# Patient Record
Sex: Female | Born: 1960 | Race: White | Hispanic: No | Marital: Single | Smoking: Current every day smoker
Health system: Southern US, Community
[De-identification: ages and names within clinical notes are randomized; demographics above are authoritative.]

## PROBLEM LIST (undated history)

## (undated) ENCOUNTER — Emergency Department (HOSPITAL_COMMUNITY): Discharge status: Against Medical Advice | Payer: Self-pay

## (undated) DIAGNOSIS — I10 Essential (primary) hypertension: Secondary | ICD-10-CM

## (undated) DIAGNOSIS — J45909 Unspecified asthma, uncomplicated: Secondary | ICD-10-CM

## (undated) DIAGNOSIS — J449 Chronic obstructive pulmonary disease, unspecified: Secondary | ICD-10-CM

## (undated) HISTORY — PX: TONSILLECTOMY: SUR1361

## (undated) HISTORY — PX: CHOLECYSTECTOMY: SHX55

## (undated) HISTORY — PX: ABDOMINAL HYSTERECTOMY: SHX81

## (undated) HISTORY — PX: APPENDECTOMY: SHX54

---

## 2003-08-26 ENCOUNTER — Emergency Department (HOSPITAL_COMMUNITY): Admission: EM | Admit: 2003-08-26 | Discharge: 2003-08-26 | Payer: Self-pay | Admitting: Emergency Medicine

## 2003-11-25 ENCOUNTER — Emergency Department (HOSPITAL_COMMUNITY): Admission: EM | Admit: 2003-11-25 | Discharge: 2003-11-25 | Payer: Self-pay | Admitting: Emergency Medicine

## 2004-04-14 ENCOUNTER — Emergency Department (HOSPITAL_COMMUNITY): Admission: EM | Admit: 2004-04-14 | Discharge: 2004-04-14 | Payer: Self-pay | Admitting: Emergency Medicine

## 2006-10-10 ENCOUNTER — Emergency Department (HOSPITAL_COMMUNITY): Admission: EM | Admit: 2006-10-10 | Discharge: 2006-10-10 | Payer: Self-pay | Admitting: Emergency Medicine

## 2011-04-09 ENCOUNTER — Other Ambulatory Visit: Payer: Self-pay

## 2011-04-09 DIAGNOSIS — D649 Anemia, unspecified: Secondary | ICD-10-CM

## 2014-11-13 ENCOUNTER — Emergency Department (HOSPITAL_BASED_OUTPATIENT_CLINIC_OR_DEPARTMENT_OTHER): Payer: Medicaid Other

## 2014-11-13 ENCOUNTER — Encounter (HOSPITAL_BASED_OUTPATIENT_CLINIC_OR_DEPARTMENT_OTHER): Payer: Self-pay

## 2014-11-13 ENCOUNTER — Emergency Department (HOSPITAL_BASED_OUTPATIENT_CLINIC_OR_DEPARTMENT_OTHER)
Admission: EM | Admit: 2014-11-13 | Discharge: 2014-11-13 | Disposition: A | Discharge status: Home / Self Care | Payer: Medicaid Other | Attending: Emergency Medicine | Admitting: Emergency Medicine

## 2014-11-13 DIAGNOSIS — I10 Essential (primary) hypertension: Secondary | ICD-10-CM | POA: Insufficient documentation

## 2014-11-13 DIAGNOSIS — Z79899 Other long term (current) drug therapy: Secondary | ICD-10-CM | POA: Diagnosis not present

## 2014-11-13 DIAGNOSIS — Z720 Tobacco use: Secondary | ICD-10-CM | POA: Insufficient documentation

## 2014-11-13 DIAGNOSIS — J441 Chronic obstructive pulmonary disease with (acute) exacerbation: Secondary | ICD-10-CM | POA: Diagnosis not present

## 2014-11-13 DIAGNOSIS — J40 Bronchitis, not specified as acute or chronic: Secondary | ICD-10-CM | POA: Diagnosis not present

## 2014-11-13 DIAGNOSIS — F419 Anxiety disorder, unspecified: Secondary | ICD-10-CM | POA: Diagnosis not present

## 2014-11-13 DIAGNOSIS — R0602 Shortness of breath: Secondary | ICD-10-CM | POA: Diagnosis present

## 2014-11-13 DIAGNOSIS — R091 Pleurisy: Secondary | ICD-10-CM

## 2014-11-13 HISTORY — DX: Essential (primary) hypertension: I10

## 2014-11-13 HISTORY — DX: Unspecified asthma, uncomplicated: J45.909

## 2014-11-13 HISTORY — DX: Chronic obstructive pulmonary disease, unspecified: J44.9

## 2014-11-13 LAB — CBC WITH DIFFERENTIAL/PLATELET
Basophils Absolute: 0.1 10*3/uL (ref 0.0–0.1)
Basophils Relative: 1 % (ref 0–1)
Eosinophils Absolute: 0.1 10*3/uL (ref 0.0–0.7)
Eosinophils Relative: 1 % (ref 0–5)
HCT: 41 % (ref 36.0–46.0)
Hemoglobin: 13.4 g/dL (ref 12.0–15.0)
Lymphocytes Relative: 23 % (ref 12–46)
Lymphs Abs: 2 10*3/uL (ref 0.7–4.0)
MCH: 26.2 pg (ref 26.0–34.0)
MCHC: 32.7 g/dL (ref 30.0–36.0)
MCV: 80.1 fL (ref 78.0–100.0)
Monocytes Absolute: 0.6 10*3/uL (ref 0.1–1.0)
Monocytes Relative: 7 % (ref 3–12)
Neutro Abs: 6 10*3/uL (ref 1.7–7.7)
Neutrophils Relative %: 68 % (ref 43–77)
Platelets: 476 10*3/uL — ABNORMAL HIGH (ref 150–400)
RBC: 5.12 MIL/uL — ABNORMAL HIGH (ref 3.87–5.11)
RDW: 17.1 % — ABNORMAL HIGH (ref 11.5–15.5)
WBC: 8.7 10*3/uL (ref 4.0–10.5)

## 2014-11-13 LAB — BASIC METABOLIC PANEL
Anion gap: 6 (ref 5–15)
BUN: 12 mg/dL (ref 6–23)
CO2: 21 mmol/L (ref 19–32)
Calcium: 9.2 mg/dL (ref 8.4–10.5)
Chloride: 110 mmol/L (ref 96–112)
Creatinine, Ser: 0.78 mg/dL (ref 0.50–1.10)
GFR calc Af Amer: >90 mL/min (ref >90)
GFR calc non Af Amer: >90 mL/min (ref >90)
Glucose, Bld: 122 mg/dL — ABNORMAL HIGH (ref 70–99)
Potassium: 4 mmol/L (ref 3.5–5.1)
Sodium: 137 mmol/L (ref 135–145)

## 2014-11-13 LAB — HEPATIC FUNCTION PANEL
ALT: 44 U/L — ABNORMAL HIGH (ref 0–35)
AST: 25 U/L (ref 0–37)
Albumin: 3.9 g/dL (ref 3.5–5.2)
Alkaline Phosphatase: 219 U/L — ABNORMAL HIGH (ref 39–117)
Bilirubin, Direct: <0.1 mg/dL (ref 0.0–0.5)
Total Bilirubin: 0.4 mg/dL (ref 0.3–1.2)
Total Protein: 7.8 g/dL (ref 6.0–8.3)

## 2014-11-13 LAB — D-DIMER, QUANTITATIVE: D-Dimer, Quant: <0.27 ug/mL-FEU (ref 0.00–0.48)

## 2014-11-13 LAB — BRAIN NATRIURETIC PEPTIDE: B Natriuretic Peptide: 36.8 pg/mL (ref 0.0–100.0)

## 2014-11-13 LAB — TROPONIN I: Troponin I: <0.03 ng/mL (ref <0.031)

## 2014-11-13 MED ORDER — IPRATROPIUM BROMIDE 0.02 % IN SOLN
0.5000 mg | Freq: Once | RESPIRATORY_TRACT | Status: AC
  Administered 2014-11-13: 0.5 mg via RESPIRATORY_TRACT
  Filled 2014-11-13: qty 2.5

## 2014-11-13 MED ORDER — PREDNISONE 20 MG PO TABS: ORAL_TABLET | ORAL | Status: DC

## 2014-11-13 MED ORDER — NAPROXEN 500 MG PO TABS: 500.0000 mg | ORAL_TABLET | Freq: Two times a day (BID) | ORAL | Status: DC

## 2014-11-13 MED ORDER — HYDROCODONE-ACETAMINOPHEN 5-325 MG PO TABS: 1.0000 | ORAL_TABLET | Freq: Four times a day (QID) | ORAL | Status: AC | PRN

## 2014-11-13 MED ORDER — ALBUTEROL SULFATE HFA 108 (90 BASE) MCG/ACT IN AERS
2.0000 | INHALATION_SPRAY | Freq: Once | RESPIRATORY_TRACT | Status: AC
  Administered 2014-11-13: 2 via RESPIRATORY_TRACT
  Filled 2014-11-13: qty 6.7

## 2014-11-13 MED ORDER — MORPHINE SULFATE 4 MG/ML IJ SOLN
4.0000 mg | Freq: Once | INTRAMUSCULAR | Status: AC
  Administered 2014-11-13: 4 mg via INTRAVENOUS
  Filled 2014-11-13: qty 1

## 2014-11-13 MED ORDER — IPRATROPIUM-ALBUTEROL 0.5-2.5 (3) MG/3ML IN SOLN
3.0000 mL | RESPIRATORY_TRACT | Status: DC
  Administered 2014-11-13: 3 mL via RESPIRATORY_TRACT

## 2014-11-13 MED ORDER — ALBUTEROL SULFATE (2.5 MG/3ML) 0.083% IN NEBU
5.0000 mg | INHALATION_SOLUTION | Freq: Once | RESPIRATORY_TRACT | Status: AC
  Administered 2014-11-13: 5 mg via RESPIRATORY_TRACT
  Filled 2014-11-13: qty 6

## 2014-11-13 MED ORDER — LORAZEPAM 1 MG PO TABS
1.0000 mg | ORAL_TABLET | Freq: Once | ORAL | Status: AC
  Administered 2014-11-13: 1 mg via ORAL
  Filled 2014-11-13: qty 1

## 2014-11-13 MED ORDER — IPRATROPIUM-ALBUTEROL 0.5-2.5 (3) MG/3ML IN SOLN
RESPIRATORY_TRACT | Status: AC
  Administered 2014-11-13: 3 mL via RESPIRATORY_TRACT
  Filled 2014-11-13: qty 3

## 2014-11-13 NOTE — ED Provider Notes (Signed)
CSN: [id number]     Arrival date & time 11/13/14  1524 History   This chart was scribed for Toy CookeyMegan Danylle Ouk, MD by Jarvis Morganaylor Ferguson, ED Scribe. This patient was seen in room MH07/MH07 and the patient's care was started at 4:22 PM.      Chief Complaint  Patient presents with  . Shortness of Breath    Patient is a 54 y.o. female presenting with shortness of breath. The history is provided by the patient. No language interpreter was used.  Shortness of Breath Severity:  Moderate Onset quality:  Gradual Duration:  3 days Timing:  Intermittent Chronicity:  New Relieved by:  Nothing Worsened by:  Deep breathing and coughing Associated symptoms: cough and wheezing   Associated symptoms: no abdominal pain, no chest pain, no diaphoresis, no fever, no headaches, no neck pain, no sore throat and no vomiting   Risk factors: tobacco use     HPI Comments: Sally Austin is a 54 y.o. female with a h/o asthma, HTN, and COPD who presents to the Emergency Department complaining of intermittent SOB for 3 days. She is having associated sharp, non radiating, right rib pain, coughing, and wheezing. She also notes some mild swelling in her bilateral legs 1 weeek ago but that has now resolved. She is a current every day smoker. She states that the SOB and right rib pain is exacerbated by coughing and deep inspiration. Pt normally takes [employer] and albuterol treatments. She states she ran out of the pro air about 3 days ago. She states she has had this pain and SOB in the past about 1 year ago when her liver enzymes were elevated and was told she was having a "spasm". She denies any h/o CHF or MIs. She has had cholecystectomy. She denies any recent antibiotic or steroid use. She denies any fever or chills.  Past Medical History  Diagnosis Date  . Asthma   . Hypertension   . COPD (chronic obstructive pulmonary disease)    Past Surgical History  Procedure Laterality Date  . Cholecystectomy    . Tonsillectomy     . Abdominal hysterectomy    . Appendectomy     History reviewed. No pertinent family history. History  Substance Use Topics  . Smoking status: Current Every Day Smoker  . Smokeless tobacco: Not on file  . Alcohol Use: No   OB History    No data available     Review of Systems  Constitutional: Negative for fever, chills, diaphoresis, activity change, appetite change and fatigue.  HENT: Negative for congestion, facial swelling, rhinorrhea and sore throat.   Eyes: Negative for photophobia and discharge.  Respiratory: Positive for cough, shortness of breath and wheezing. Negative for chest tightness.   Cardiovascular: Negative for chest pain, palpitations and leg swelling.  Gastrointestinal: Negative for nausea, vomiting, abdominal pain and diarrhea.  Endocrine: Negative for polydipsia and polyuria.  Genitourinary: Negative for dysuria, frequency, difficulty urinating and pelvic pain.  Musculoskeletal: Negative for back pain, arthralgias, neck pain and neck stiffness.  Skin: Negative for color change and wound.  Allergic/Immunologic: Negative for immunocompromised state.  Neurological: Negative for facial asymmetry, weakness, numbness and headaches.  Hematological: Does not bruise/bleed easily.  Psychiatric/Behavioral: Negative for confusion and agitation.      Allergies  Review of patient's allergies indicates no known allergies.  Home Medications   Prior to Admission medications   Medication Sig Start Date End Date Taking? Authorizing Provider  albuterol (PROVENTIL HFA;VENTOLIN HFA) 108 (90 BASE) MCG/ACT  inhaler Inhale 2 puffs into the lungs every 6 (six) hours as needed for wheezing or shortness of breath.   Yes Historical Provider, MD  ALBUTEROL IN Inhale into the lungs.   Yes Historical Provider, MD  LISINOPRIL PO Take by mouth.   Yes Historical Provider, MD  Metoprolol Succinate (TOPROL XL PO) Take by mouth.   Yes Historical Provider, MD  TRAMADOL HCL ER PO Take by  mouth.   Yes Historical Provider, MD  VERAPAMIL HCL ER, CO, PO Take by mouth.   Yes Historical Provider, MD  HYDROcodone-acetaminophen (NORCO) 5-325 MG per tablet Take 1 tablet by mouth every 6 (six) hours as needed. 11/13/14   Toy Cookey, MD  naproxen (NAPROSYN) 500 MG tablet Take 1 tablet (500 mg total) by mouth 2 (two) times daily with a meal. 11/13/14   Toy Cookey, MD  predniSONE (DELTASONE) 20 MG tablet 3 tabs po day one, then 2 po daily x 4 days 11/13/14   Toy Cookey, MD   Triage Vitals: BP 153/99 mmHg  Pulse 88  Temp(Src) 98.9 F (37.2 C) (Oral)  Resp 28  Ht  (1.778 m)  Wt 200 lb (90.719 kg)  BMI 28.70 kg/m2  SpO2 99%  Physical Exam  Constitutional: She is oriented to person, place, and time. She appears well-developed and well-nourished. No distress.  Anxious  HENT:  Head: Normocephalic.  Mouth/Throat: Oropharynx is clear and moist.  Eyes: Pupils are equal, round, and reactive to light.  Neck: Neck supple.  Cardiovascular: Normal rate, regular rhythm and normal heart sounds.   Pulmonary/Chest: Effort normal. Tachypnea noted. No respiratory distress. She has wheezes (in all lung fields). She exhibits tenderness.  Right anterior chest wall pain  Abdominal: Soft. She exhibits no distension. There is no tenderness. There is no rebound and no guarding.  Musculoskeletal: She exhibits no edema or tenderness.  Neurological: She is alert and oriented to person, place, and time.  Skin: Skin is warm and dry.  Psychiatric: She has a normal mood and affect.  Nursing note and vitals reviewed.   ED Course  Procedures (including critical care time)  DIAGNOSTIC STUDIES: Oxygen Saturation is 99% on 2 LPM via [place], normal by my interpretation.    COORDINATION OF CARE: 4:30 PM- Will order CXR, diagnostic lab work, breathing treatment and Ativan. Pt advised of plan for treatment and pt agrees.  Labs Review Labs Reviewed  CBC WITH DIFFERENTIAL/PLATELET - Abnormal; Notable  for the following:    RBC 5.12 (*)    RDW 17.1 (*)    Platelets 476 (*)    All other components within normal limits  BASIC METABOLIC PANEL - Abnormal; Notable for the following:    Glucose, Bld 122 (*)    All other components within normal limits  HEPATIC FUNCTION PANEL - Abnormal; Notable for the following:    ALT 44 (*)    Alkaline Phosphatase 219 (*)    All other components within normal limits  TROPONIN I  BRAIN NATRIURETIC PEPTIDE  D-DIMER, QUANTITATIVE    Imaging Review Dg Chest 2 View  11/13/2014   CLINICAL DATA:  Pt states that for the past 3 days she has had a cough with sob, wheezing and right rib pain. Hx of asthma, htn, copd and is a current smoker.  EXAM: CHEST  2 VIEW  COMPARISON:  05/25/2013  FINDINGS: Cardiac silhouette normal in size and configuration. Normal mediastinal and hilar contours.  Lungs demonstrate prominent bronchovascular markings but no areas of consolidation and no  evidence of edema. No pleural effusion or pneumothorax.  Bony thorax is demineralized but intact.  IMPRESSION: No acute cardiopulmonary disease.   Electronically Signed   By: Amie Portland M.D.   On: 11/13/2014 17:05     EKG Interpretation   Date/Time:  Saturday November 13 2014 15:40:56 EST Ventricular Rate:  85 PR Interval:  140 QRS Duration: 92 QT Interval:  384 QTC Calculation: 456 R Axis:   16 Text Interpretation:  Normal sinus rhythm Normal ECG No prior for  comparison Confirmed by Jaysiah Marchetta  MD, Amiere Cawley ([id number]) on 11/13/2014 4:12:35 PM      MDM   Final diagnoses:  Bronchitis  Pleurisy   Patient presents with about 3 days of cough, shortness of breath and a sharp right-sided anterior chest wall pain which is worse with cough and deep breathing.  She has not had fevers, chills, leg pain or swelling.  She's a history of similar symptoms several years ago.  On physical exam, vital signs are stable and she is in no acute distress.  She is scattered wheezing throughout on lung exam has  a localized area of chest wall tenderness.  Patient received to do nebs with improvement of breath sounds.  Pain also improved with dose of narcotic analgesics.  EKG with no acute ischemic changes.  Troponin not elevated.  BNP not elevated.  D-dimer also not elevated.  Do not feel symptoms are consistent with ACS and I feel PE is unlikely given normal heart rate, no leg pain or swelling, normal d-dimer.  Feel she likely has bronchitis with pleurisy.  Patient will be discharged home with by mouth steroids naproxen and Norco for pain.  She can return to the ED for worsening symptoms  I personally performed the services described in this documentation, which was scribed in my presence. The recorded information has been reviewed and is accurate.      Toy Cookey, MD 11/14/14 [phone]

## 2014-11-13 NOTE — ED Notes (Addendum)
Pt reports feeling short of breath, coughing, right rib cage pain, wheezing, x3 days. Pt also reports a fall one week ago where she hit her head.

## 2014-11-13 NOTE — ED Notes (Signed)
Patient transported to X-ray 

## 2014-11-13 NOTE — ED Notes (Signed)
Pt alert x4 respirations easy non labored.  

## 2014-11-13 NOTE — Discharge Instructions (Signed)
Acute Bronchitis Bronchitis is inflammation of the airways that extend from the windpipe into the lungs (bronchi). The inflammation often causes mucus to develop. This leads to a cough, which is the most common symptom of bronchitis.  In acute bronchitis, the condition usually develops suddenly and goes away over time, usually in a couple weeks. Smoking, allergies, and asthma can make bronchitis worse. Repeated episodes of bronchitis may cause further lung problems.  CAUSES Acute bronchitis is most often caused by the same virus that causes a cold. The virus can spread from person to person (contagious) through coughing, sneezing, and touching contaminated objects. SIGNS AND SYMPTOMS   Cough.   Fever.   Coughing up mucus.   Body aches.   Chest congestion.   Chills.   Shortness of breath.   Sore throat.  DIAGNOSIS  Acute bronchitis is usually diagnosed through a physical exam. Your health care provider will also ask you questions about your medical history. Tests, such as chest X-rays, are sometimes done to rule out other conditions.  TREATMENT  Acute bronchitis usually goes away in a couple weeks. Oftentimes, no medical treatment is necessary. Medicines are sometimes given for relief of fever or cough. Antibiotic medicines are usually not needed but may be prescribed in certain situations. In some cases, an inhaler may be recommended to help reduce shortness of breath and control the cough. A cool mist vaporizer may also be used to help thin bronchial secretions and make it easier to clear the chest.  HOME CARE INSTRUCTIONS  Get plenty of rest.   Drink enough fluids to keep your urine clear or pale yellow (unless you have a medical condition that requires fluid restriction). Increasing fluids may help thin your respiratory secretions (sputum) and reduce chest congestion, and it will prevent dehydration.   Take medicines only as directed by your health care provider.  If  you were prescribed an antibiotic medicine, finish it all even if you start to feel better.  Avoid smoking and secondhand smoke. Exposure to cigarette smoke or irritating chemicals will make bronchitis worse. If you are a smoker, consider using nicotine gum or skin patches to help control withdrawal symptoms. Quitting smoking will help your lungs heal faster.   Reduce the chances of another bout of acute bronchitis by washing your hands frequently, avoiding people with cold symptoms, and trying not to touch your hands to your mouth, nose, or eyes.   Keep all follow-up visits as directed by your health care provider.  SEEK MEDICAL CARE IF: Your symptoms do not improve after 1 week of treatment.  SEEK IMMEDIATE MEDICAL CARE IF:  You develop an increased fever or chills.   You have chest pain.   You have severe shortness of breath.  You have bloody sputum.   You develop dehydration.  You faint or repeatedly feel like you are going to pass out.  You develop repeated vomiting.  You develop a severe headache. MAKE SURE YOU:   Understand these instructions.  Will watch your condition.  Will get help right away if you are not doing well or get worse. Document Released: 10/18/2004 Document Revised: 01/25/2014 Document Reviewed: 03/03/2013 [facility] Patient Information 2015 [place], [place]. This information is not intended to replace advice given to you by your health care provider. Make sure you discuss any questions you have with your health care provider.  Pleurisy Pleurisy is an inflammation and swelling of the lining of the lungs (pleura). Because of this inflammation, it hurts to breathe. It  can be aggravated by coughing, laughing, or deep breathing. Pleurisy is often caused by an underlying infection or disease.  HOME CARE INSTRUCTIONS  Monitor your pleurisy for any changes. The following actions may help to alleviate any discomfort you are experiencing:  Medicine may help  with pain. Only take over-the-counter or prescription medicines for pain, discomfort, or fever as directed by your health care provider.  Only take antibiotic medicine as directed. Make sure to finish it even if you start to feel better. SEEK MEDICAL CARE IF:   Your pain is not controlled with medicine or is increasing.  You have an increase in pus-like (purulent) secretions brought up with coughing. SEEK IMMEDIATE MEDICAL CARE IF:   You have blue or dark lips, fingernails, or toenails.  You are coughing up blood.  You have increased difficulty breathing.  You have continuing pain unrelieved by medicine or pain lasting more than 1 week.  You have pain that radiates into your neck, arms, or jaw.  You develop increased shortness of breath or wheezing.  You develop a fever, rash, vomiting, fainting, or other serious symptoms. MAKE SURE YOU:  Understand these instructions.   Will watch your condition.   Will get help right away if you are not doing well or get worse.  Document Released: 09/10/2005 Document Revised: 05/13/2013 Document Reviewed: 02/22/2013 [facility] Patient Information 2015 [place]. This information is not intended to replace advice given to you by your health care provider. Make sure you discuss any questions you have with your health care provider.

## 2015-04-03 ENCOUNTER — Emergency Department (HOSPITAL_BASED_OUTPATIENT_CLINIC_OR_DEPARTMENT_OTHER): Payer: Medicaid Other

## 2015-04-03 ENCOUNTER — Encounter (HOSPITAL_BASED_OUTPATIENT_CLINIC_OR_DEPARTMENT_OTHER): Payer: Self-pay | Admitting: Emergency Medicine

## 2015-04-03 ENCOUNTER — Emergency Department (HOSPITAL_BASED_OUTPATIENT_CLINIC_OR_DEPARTMENT_OTHER)
Admission: EM | Admit: 2015-04-03 | Discharge: 2015-04-03 | Disposition: A | Discharge status: Home / Self Care | Payer: Medicaid Other | Attending: Emergency Medicine | Admitting: Emergency Medicine

## 2015-04-03 DIAGNOSIS — I10 Essential (primary) hypertension: Secondary | ICD-10-CM | POA: Diagnosis not present

## 2015-04-03 DIAGNOSIS — Z7952 Long term (current) use of systemic steroids: Secondary | ICD-10-CM | POA: Insufficient documentation

## 2015-04-03 DIAGNOSIS — Z720 Tobacco use: Secondary | ICD-10-CM | POA: Insufficient documentation

## 2015-04-03 DIAGNOSIS — R079 Chest pain, unspecified: Secondary | ICD-10-CM | POA: Diagnosis present

## 2015-04-03 DIAGNOSIS — J441 Chronic obstructive pulmonary disease with (acute) exacerbation: Secondary | ICD-10-CM | POA: Insufficient documentation

## 2015-04-03 DIAGNOSIS — Z79899 Other long term (current) drug therapy: Secondary | ICD-10-CM | POA: Insufficient documentation

## 2015-04-03 LAB — COMPREHENSIVE METABOLIC PANEL
ALK PHOS: 194 U/L — AB (ref 38–126)
ALT: 29 U/L (ref 14–54)
ANION GAP: 15 (ref 5–15)
AST: 26 U/L (ref 15–41)
Albumin: 4 g/dL (ref 3.5–5.0)
BILIRUBIN TOTAL: 0.4 mg/dL (ref 0.3–1.2)
BUN: 14 mg/dL (ref 6–20)
CALCIUM: 10.3 mg/dL (ref 8.9–10.3)
CO2: 18 mmol/L — ABNORMAL LOW (ref 22–32)
CREATININE: 0.98 mg/dL (ref 0.44–1.00)
Chloride: 106 mmol/L (ref 101–111)
GFR calc Af Amer: >60 mL/min (ref >60)
GFR calc non Af Amer: >60 mL/min (ref >60)
GLUCOSE: 129 mg/dL — AB (ref 65–99)
POTASSIUM: 4.4 mmol/L (ref 3.5–5.1)
Sodium: 139 mmol/L (ref 135–145)
Total Protein: 7.9 g/dL (ref 6.5–8.1)

## 2015-04-03 LAB — TROPONIN I: Troponin I: <0.03 ng/mL (ref <0.031)

## 2015-04-03 LAB — CBC
HCT: 46.9 % — ABNORMAL HIGH (ref 36.0–46.0)
Hemoglobin: 15.2 g/dL — ABNORMAL HIGH (ref 12.0–15.0)
MCH: 25.3 pg — ABNORMAL LOW (ref 26.0–34.0)
MCHC: 32.4 g/dL (ref 30.0–36.0)
MCV: 78.2 fL (ref 78.0–100.0)
Platelets: 622 10*3/uL — ABNORMAL HIGH (ref 150–400)
RBC: 6 MIL/uL — AB (ref 3.87–5.11)
RDW: 18.8 % — ABNORMAL HIGH (ref 11.5–15.5)
WBC: 12.6 10*3/uL — ABNORMAL HIGH (ref 4.0–10.5)

## 2015-04-03 LAB — D-DIMER, QUANTITATIVE: D-Dimer, Quant: <0.27 ug/mL-FEU (ref 0.00–0.48)

## 2015-04-03 MED ORDER — DOXYCYCLINE HYCLATE 100 MG PO CAPS: 100.0000 mg | ORAL_CAPSULE | Freq: Two times a day (BID) | ORAL | Status: AC

## 2015-04-03 MED ORDER — LORAZEPAM 2 MG/ML IJ SOLN
0.5000 mg | Freq: Once | INTRAMUSCULAR | Status: AC
  Administered 2015-04-03: 0.5 mg via INTRAVENOUS
  Filled 2015-04-03: qty 1

## 2015-04-03 MED ORDER — OXYCODONE-ACETAMINOPHEN 5-325 MG PO TABS
1.0000 | ORAL_TABLET | ORAL | Status: AC | PRN
Start: 2015-04-03 — End: ?

## 2015-04-03 MED ORDER — PREDNISONE 10 MG PO TABS: 20.0000 mg | ORAL_TABLET | Freq: Every day | ORAL | Status: AC

## 2015-04-03 MED ORDER — MORPHINE SULFATE 2 MG/ML IJ SOLN
2.0000 mg | Freq: Once | INTRAMUSCULAR | Status: AC
  Administered 2015-04-03: 2 mg via INTRAVENOUS
  Filled 2015-04-03: qty 1

## 2015-04-03 MED ORDER — METHYLPREDNISOLONE SODIUM SUCC 125 MG IJ SOLR
125.0000 mg | Freq: Once | INTRAMUSCULAR | Status: AC
  Administered 2015-04-03: 125 mg via INTRAVENOUS
  Filled 2015-04-03: qty 2

## 2015-04-03 MED ORDER — ALBUTEROL (5 MG/ML) CONTINUOUS INHALATION SOLN
10.0000 mg/h | INHALATION_SOLUTION | Freq: Once | RESPIRATORY_TRACT | Status: AC
  Administered 2015-04-03: 10 mg/h via RESPIRATORY_TRACT
  Filled 2015-04-03: qty 20

## 2015-04-03 NOTE — ED Provider Notes (Signed)
CSN: [id number]     Arrival date & time 04/03/15  1512 History  This chart was scribed for Sally Grizzle, MD by Abel Presto, ED Scribe. This patient was seen in room MH07/MH07 and the patient's care was started at 3:32 PM.     Chief Complaint  Patient presents with  . Chest Pain     The history is provided by the patient. No language interpreter was used.   HPI Comments: Sally Austin is a 54 y.o. female with PMHx of HTN who presents to the Emergency Department complaining of chest pain with onset 2 days ago. She states pain feels as though she has a cinder block on her chest.  Pt notes associated cough with onset 3 days ago worsening today with associated SOB and heaviness with onset last night, headache yesterday, and nausea. She states she feels clammy. Pt with h/o asthma and COPD. She has tried her albuterol nebulizer with no relief.  She is a smoker. She denies h/o DVT/ PE. Recent h/o bronchitis and pleurisy in 10/2014. She denies vomiting, fever, and chills.   Past Medical History  Diagnosis Date  . Asthma   . Hypertension   . COPD (chronic obstructive pulmonary disease)    Past Surgical History  Procedure Laterality Date  . Cholecystectomy    . Tonsillectomy    . Abdominal hysterectomy    . Appendectomy     History reviewed. No pertinent family history. History  Substance Use Topics  . Smoking status: Current Every Day Smoker  . Smokeless tobacco: Not on file  . Alcohol Use: No   OB History    No data available     Review of Systems  Constitutional: Positive for diaphoresis. Negative for fever and chills.  Respiratory: Positive for chest tightness and shortness of breath.   Cardiovascular: Positive for chest pain.  Gastrointestinal: Positive for nausea. Negative for vomiting.  Neurological: Positive for headaches (resolved).  All other systems reviewed and are negative.     Allergies  Review of patient's allergies indicates no known allergies.  Home  Medications   Prior to Admission medications   Medication Sig Start Date End Date Taking? Authorizing Provider  albuterol (PROVENTIL HFA;VENTOLIN HFA) 108 (90 BASE) MCG/ACT inhaler Inhale 2 puffs into the lungs every 6 (six) hours as needed for wheezing or shortness of breath.    Historical Provider, MD  ALBUTEROL IN Inhale into the lungs.    Historical Provider, MD  HYDROcodone-acetaminophen (NORCO) 5-325 MG per tablet Take 1 tablet by mouth every 6 (six) hours as needed. 11/13/14   Toy Cookey, MD  LISINOPRIL PO Take by mouth.    Historical Provider, MD  Metoprolol Succinate (TOPROL XL PO) Take by mouth.    Historical Provider, MD  naproxen (NAPROSYN) 500 MG tablet Take 1 tablet (500 mg total) by mouth 2 (two) times daily with a meal. 11/13/14   Toy Cookey, MD  predniSONE (DELTASONE) 20 MG tablet 3 tabs po day one, then 2 po daily x 4 days 11/13/14   Toy Cookey, MD  TRAMADOL HCL ER PO Take by mouth.    Historical Provider, MD  VERAPAMIL HCL ER, CO, PO Take by mouth.    Historical Provider, MD   BP 139/82 mmHg  Pulse 129  Temp(Src) 98.9 F (37.2 C) (Oral)  Resp 42  SpO2 100% Physical Exam  Constitutional: She is oriented to person, place, and time. She appears well-developed and well-nourished.  HENT:  Head: Normocephalic.  Eyes: Conjunctivae are normal.  Neck: Normal range of motion. Neck supple.  Pulmonary/Chest: She is in respiratory distress. She has wheezes (expiratory).  Musculoskeletal: Normal range of motion.  Neurological: She is alert and oriented to person, place, and time.  Skin: Skin is warm and dry.  Psychiatric: She has a normal mood and affect. Her behavior is normal.  Nursing note and vitals reviewed.   ED Course  Procedures (including critical care time) DIAGNOSTIC STUDIES: Oxygen Saturation is 100% on room air, normal by my interpretation.    COORDINATION OF CARE: 3:36 PM Discussed treatment plan with patient at beside, the patient agrees with the  plan and has no further questions at this time.   Labs Review Labs Reviewed  CBC - Abnormal; Notable for the following:    WBC 12.6 (*)    RBC 6.00 (*)    Hemoglobin 15.2 (*)    HCT 46.9 (*)    MCH 25.3 (*)    RDW 18.8 (*)    Platelets 622 (*)    All other components within normal limits  COMPREHENSIVE METABOLIC PANEL - Abnormal; Notable for the following:    CO2 18 (*)    Glucose, Bld 129 (*)    Alkaline Phosphatase 194 (*)    All other components within normal limits  TROPONIN I  D-DIMER, QUANTITATIVE (NOT AT [facility])  TROPONIN I    Imaging Review Dg Chest Port 1 View  04/03/2015   CLINICAL DATA:  Chest pain for 2 days and worsening shortness of breath.  EXAM: PORTABLE CHEST - 1 VIEW  COMPARISON:  11/13/2014  FINDINGS: The heart size and mediastinal contours are within normal limits. Both lungs are clear. The visualized skeletal structures are unremarkable.  IMPRESSION: Normal chest x-Billyjack Trompeter.   Electronically Signed   By: Rudie MeyerP.  Gallerani M.D.   On: 04/03/2015 16:08     EKG Interpretation None     5:04 PM Pt reports some relief with treatment. 6:27 PM Dr. Rosalia Hammersay in room to update pt of results.  MDM   Final diagnoses:  COPD exacerbation    54 year old female history of asthma who comes in today with dyspnea and wheezing.she is complaining of pain with deep inspiration and coughing. She is to On initial exam with a few expiratory wheezes but good air movement. Patient has had 2 EKGs here which both showed no evidence of ischemia. Repeat troponins are normal.  Patient has remained hemodynamically stable here. I have a low index of suspicion for coronary artery disease given that she has had 2 EKGs without ischemic changes and repeat troponins. She has also had a d-dimer here shows no evidence of acute ischemia she has been oxygenating and ventilating well. She has an inhaler at home. She'll be placed on 5 days of prednisone and she has follow-up. She voices understanding of return  precautions and need for follow-up.    Sally Grizzleanielle Kimbly Eanes, MD 04/03/15 2007

## 2015-04-03 NOTE — Discharge Instructions (Signed)
Chronic Obstructive Pulmonary Disease Chronic obstructive pulmonary disease (COPD) is a common lung problem. In COPD, the flow of air from the lungs is limited. The way your lungs work will probably never return to normal, but there are things you can do to improve your lungs and make yourself feel better. HOME CARE  Take all medicines as told by your doctor.  Avoid medicines or cough syrups that dry up your airway (such as antihistamines) and do not allow you to get rid of thick spit. You do not need to avoid them if told differently by your doctor.  If you smoke, stop. Smoking makes the problem worse.  Avoid being around things that make your breathing worse (like smoke, chemicals, and fumes).  Use oxygen therapy and therapy to help improve your lungs (pulmonary rehabilitation) if told by your doctor. If you need home oxygen therapy, ask your doctor if you should buy a tool to measure your oxygen level (oximeter).  Avoid people who have a sickness you can catch (contagious).  Avoid going outside when it is very hot, cold, or humid.  Eat healthy foods. Eat smaller meals more often. Rest before meals.  Stay active, but remember to also rest.  Make sure to get all the shots (vaccines) your doctor recommends. Ask your doctor if you need a pneumonia shot.  Learn and use tips on how to relax.  Learn and use tips on how to control your breathing as told by your doctor. Try:  Breathing in (inhaling) through your nose for 1 second. Then, pucker your lips and breath out (exhale) through your lips for 2 seconds.  Putting one hand on your belly (abdomen). Breathe in slowly through your nose for 1 second. Your hand on your belly should move out. Pucker your lips and breathe out slowly through your lips. Your hand on your belly should move in as you breathe out.  Learn and use controlled coughing to clear thick spit from your lungs. The steps are: 1. Lean your head a little forward. 2. Breathe  in deeply. 3. Try to hold your breath for 3 seconds. 4. Keep your mouth slightly open while coughing 2 times. 5. Spit any thick spit out into a tissue. 6. Rest and do the steps again 1 or 2 times as needed. GET HELP IF:  You cough up more thick spit than usual.  There is a change in the color or thickness of the spit.  It is harder to breathe than usual.  Your breathing is faster than usual. GET HELP RIGHT AWAY IF:   You have shortness of breath while resting.  You have shortness of breath that stops you from:  Being able to talk.  Doing normal activities.  You chest hurts for longer than 5 minutes.  Your skin color is more blue than usual.  Your pulse oximeter shows that you have low oxygen for longer than 5 minutes. MAKE SURE YOU:   Understand these instructions.  Will watch your condition.  Will get help right away if you are not doing well or get worse. Document Released: 02/27/2008 Document Revised: 01/25/2014 Document Reviewed: 05/07/2013 [employer] Patient Information 2015 [employer]. This information is not intended to replace advice given to you by your health care provider. Make sure you discuss any questions you have with your health care provider.  

## 2015-04-03 NOTE — ED Notes (Signed)
Patient states that she has had off and on chest pain x 2 days and today it is worsening and having SOB. The patient is diaphoretic and hyperventilating. The patient is extremely tearful as well. Reports that that she feels like there is a cinder block sitting on her chest.

## 2015-05-09 ENCOUNTER — Emergency Department (HOSPITAL_COMMUNITY): Payer: Medicaid Other

## 2015-05-09 ENCOUNTER — Encounter (HOSPITAL_COMMUNITY): Payer: Self-pay | Admitting: Emergency Medicine

## 2015-05-09 ENCOUNTER — Emergency Department (HOSPITAL_COMMUNITY)
Admission: EM | Admit: 2015-05-09 | Discharge: 2015-05-10 | Disposition: A | Discharge status: Home / Self Care | Payer: Medicaid Other | Attending: Emergency Medicine | Admitting: Emergency Medicine

## 2015-05-09 DIAGNOSIS — W19XXXA Unspecified fall, initial encounter: Secondary | ICD-10-CM

## 2015-05-09 DIAGNOSIS — Z792 Long term (current) use of antibiotics: Secondary | ICD-10-CM | POA: Insufficient documentation

## 2015-05-09 DIAGNOSIS — Y9389 Activity, other specified: Secondary | ICD-10-CM | POA: Diagnosis not present

## 2015-05-09 DIAGNOSIS — Y9289 Other specified places as the place of occurrence of the external cause: Secondary | ICD-10-CM | POA: Diagnosis not present

## 2015-05-09 DIAGNOSIS — Y998 Other external cause status: Secondary | ICD-10-CM | POA: Insufficient documentation

## 2015-05-09 DIAGNOSIS — Z7952 Long term (current) use of systemic steroids: Secondary | ICD-10-CM | POA: Insufficient documentation

## 2015-05-09 DIAGNOSIS — W01198A Fall on same level from slipping, tripping and stumbling with subsequent striking against other object, initial encounter: Secondary | ICD-10-CM | POA: Insufficient documentation

## 2015-05-09 DIAGNOSIS — M545 Low back pain, unspecified: Secondary | ICD-10-CM

## 2015-05-09 DIAGNOSIS — Z791 Long term (current) use of non-steroidal anti-inflammatories (NSAID): Secondary | ICD-10-CM | POA: Insufficient documentation

## 2015-05-09 DIAGNOSIS — I10 Essential (primary) hypertension: Secondary | ICD-10-CM | POA: Insufficient documentation

## 2015-05-09 DIAGNOSIS — Z720 Tobacco use: Secondary | ICD-10-CM | POA: Insufficient documentation

## 2015-05-09 DIAGNOSIS — S199XXA Unspecified injury of neck, initial encounter: Secondary | ICD-10-CM | POA: Insufficient documentation

## 2015-05-09 DIAGNOSIS — S3992XA Unspecified injury of lower back, initial encounter: Secondary | ICD-10-CM | POA: Diagnosis not present

## 2015-05-09 DIAGNOSIS — S0990XA Unspecified injury of head, initial encounter: Secondary | ICD-10-CM | POA: Insufficient documentation

## 2015-05-09 DIAGNOSIS — J449 Chronic obstructive pulmonary disease, unspecified: Secondary | ICD-10-CM | POA: Diagnosis not present

## 2015-05-09 DIAGNOSIS — Z79899 Other long term (current) drug therapy: Secondary | ICD-10-CM | POA: Insufficient documentation

## 2015-05-09 MED ORDER — FENTANYL CITRATE (PF) 100 MCG/2ML IJ SOLN
50.0000 ug | Freq: Once | INTRAMUSCULAR | Status: AC
  Administered 2015-05-09: 50 ug via INTRAVENOUS
  Filled 2015-05-09: qty 2

## 2015-05-09 MED ORDER — ONDANSETRON HCL 4 MG/2ML IJ SOLN
4.0000 mg | Freq: Once | INTRAMUSCULAR | Status: AC
  Administered 2015-05-09: 4 mg via INTRAVENOUS
  Filled 2015-05-09: qty 2

## 2015-05-09 NOTE — ED Notes (Signed)
Pt arrived to the ED with a complaint of a fall with the result of lower back pain, neck pain. Bilateral leg pain and painful breathing.  Pt states she went back in a chair and it slipped and hit a concrete protrusion in the middle of her back  Pt states the pain has progressively gotten worse

## 2015-05-09 NOTE — ED Notes (Signed)
Patient transported to CT 

## 2015-05-10 MED ORDER — DIAZEPAM 5 MG PO TABS
5.0000 mg | ORAL_TABLET | Freq: Once | ORAL | Status: AC
  Administered 2015-05-10: 5 mg via ORAL
  Filled 2015-05-10: qty 1

## 2015-05-10 MED ORDER — HYDROCODONE-ACETAMINOPHEN 5-325 MG PO TABS
2.0000 | ORAL_TABLET | Freq: Once | ORAL | Status: AC
  Administered 2015-05-10: 2 via ORAL
  Filled 2015-05-10: qty 2

## 2015-05-10 NOTE — Discharge Instructions (Signed)
Please call your primary care doctor tomorrow to set up appropriate follow-up and further discussion regarding pain management. At this time take Tylenol and naproxen as needed for pain control. Your x-rays and CT scans did not show acute injuries today. Back Pain, Adult Back pain is very common. The pain often gets better over time. The cause of back pain is usually not dangerous. Most people can learn to manage their back pain on their own.  HOME CARE   Stay active. Start with short walks on flat ground if you can. Try to walk farther each day.  Do not sit, drive, or stand in one place for more than 30 minutes. Do not stay in bed.  Do not avoid exercise or work. Activity can help your back heal faster.  Be careful when you bend or lift an object. Bend at your knees, keep the object close to you, and do not twist.  Sleep on a firm mattress. Lie on your side, and bend your knees. If you lie on your back, put a pillow under your knees.  Only take medicines as told by your doctor.  Put ice on the injured area.  Put ice in a plastic bag.  Place a towel between your skin and the bag.  Leave the ice on for 15-20 minutes, 03-04 times a day for the first 2 to 3 days. After that, you can switch between ice and heat packs.  Ask your doctor about back exercises or massage.  Avoid feeling anxious or stressed. Find good ways to deal with stress, such as exercise. GET HELP RIGHT AWAY IF:   Your pain does not go away with rest or medicine.  Your pain does not go away in 1 week.  You have new problems.  You do not feel well.  The pain spreads into your legs.  You cannot control when you poop (bowel movement) or pee (urinate).  Your arms or legs feel weak or lose feeling (numbness).  You feel sick to your stomach (nauseous) or throw up (vomit).  You have belly (abdominal) pain.  You feel like you may pass out (faint). MAKE SURE YOU:   Understand these instructions.  Will watch  your condition.  Will get help right away if you are not doing well or get worse. Document Released: 02/27/2008 Document Revised: 12/03/2011 Document Reviewed: 01/12/2014 [facility] Patient Information 2015 [place], [place]. This information is not intended to replace advice given to you by your health care provider. Make sure you discuss any questions you have with your health care provider.

## 2015-05-10 NOTE — ED Provider Notes (Signed)
CSN: [id number]     Arrival date & time 05/09/15  2130 History   First MD Initiated Contact with Patient 05/09/15 2302     Chief Complaint  Patient presents with  . Fall     (Consider location/radiation/quality/duration/timing/severity/associated sxs/prior Treatment) HPI 54 year old female who presents after fall. She does not take any anticoagulation and has a history of hypertension and COPD. Reports pain in her usual state of health. Was leaning back in a chair today, had fallen backwards hitting her head and back against the ground. Reports possibly having loss of consciousness for a few seconds, and currently reports headache to the back of her head where she hit her head. Also complains of neck pain, and low back pain, where she has had prior surgery. Has been able to stand and walk after her injury. Denies any vision changes, speech changes, focal weakness or numbness, nausea or vomiting. Denies chest pain, difficulty breathing, or abdominal pain.  Past Medical History  Diagnosis Date  . Asthma   . Hypertension   . COPD (chronic obstructive pulmonary disease)    Past Surgical History  Procedure Laterality Date  . Cholecystectomy    . Tonsillectomy    . Abdominal hysterectomy    . Appendectomy     History reviewed. No pertinent family history. Social History  Substance Use Topics  . Smoking status: Current Every Day Smoker  . Smokeless tobacco: None  . Alcohol Use: No   OB History    No data available     Review of Systems 10/14 systems reviewed and are negative other than those stated in the HPI   Allergies  Review of patient's allergies indicates no known allergies.  Home Medications   Prior to Admission medications   Medication Sig Start Date End Date Taking? Authorizing Provider  albuterol (PROVENTIL HFA;VENTOLIN HFA) 108 (90 BASE) MCG/ACT inhaler Inhale 2 puffs into the lungs every 6 (six) hours as needed for wheezing or shortness of breath.    Historical  Provider, MD  ALBUTEROL IN Inhale into the lungs.    Historical Provider, MD  doxycycline (VIBRAMYCIN) 100 MG capsule Take 1 capsule (100 mg total) by mouth 2 (two) times daily. 04/03/15   [name], MD  HYDROcodone-acetaminophen (NORCO) 5-325 MG per tablet Take 1 tablet by mouth every 6 (six) hours as needed. 11/13/14   Toy Cookey, MD  LISINOPRIL PO Take 20 mg by mouth.     Historical Provider, MD  Metoprolol Succinate (TOPROL XL PO) Take by mouth.    Historical Provider, MD  naproxen (NAPROSYN) 500 MG tablet Take 1 tablet (500 mg total) by mouth 2 (two) times daily with a meal. 11/13/14   Toy Cookey, MD  oxyCODONE-acetaminophen (PERCOCET/ROXICET) 5-325 MG per tablet Take 1 tablet by mouth every 4 (four) hours as needed for severe pain. 04/03/15   [name], MD  predniSONE (DELTASONE) 10 MG tablet Take 2 tablets (20 mg total) by mouth daily. 04/03/15   [name], MD  TRAMADOL HCL ER PO Take by mouth.    Historical Provider, MD  VERAPAMIL HCL ER, CO, PO Take by mouth.    Historical Provider, MD   BP 143/74 mmHg  Pulse 88  Temp(Src) 97.8 F (36.6 C) (Oral)  Resp 20  Ht 5\' 10"  (1.778 m)  Wt 210 lb (95.255 kg)  BMI 30.13 kg/m2  SpO2 99% Physical Exam Physical Exam  Nursing note and vitals reviewed. Constitutional: Well developed, well nourished, non-toxic, and in no acute distress Head: Normocephalic  and atraumatic. No wounds or lacerations. Mouth/Throat: Oropharynx is clear and moist.  Neck: Normal range of motion. Neck supple.  tenderness to palpation of bilateral paraspinal neck. Cardiovascular: Normal rate and regular rhythm.   +2 radial pulses bilaterally. Pulmonary/Chest: Effort normal and breath sounds normal.  no chest wall tenderness. Abdominal: Soft. There is no tenderness. There is no rebound and no guarding.  Musculoskeletal: Normal range of motion. No deformities. Tenderness to palpation of her lumbar spine and paraspinally on the right side. No step-offs or  malalignment.  Neurological: Alert, no facial droop, fluent speech, moves all extremities symmetrically, sensation to light touch intact throughout Skin: Skin is warm and dry.  Psychiatric: Cooperative  ED Course  Procedures (including critical care time) Labs Review Labs Reviewed - No data to display  Imaging Review Dg Lumbar Spine Complete  05/10/2015   CLINICAL DATA:  Low back pain after fall from chair. Initial encounter.  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  L5-S1 discectomy with posterior rod and pedicle screw fixation. Bony fusion is complete and hardware is well seated.  No acute fracture or traumatic malalignment.  Spondylosis with spurring greatest at L2-3. There is mild to moderate disc narrowing at L1-2 and L4-5.  IMPRESSION: 1. No evidence of lumbar spine injury. 2. L5-S1 discectomy with complete bony fusion. 3. Transitional lumbosacral anatomy.   Electronically Signed   By: Marnee Spring M.D.   On: 05/10/2015 00:21   Ct Head Wo Contrast  05/10/2015   CLINICAL DATA:  Fall with head injury and neck pain. Headache. Initial encounter.  EXAM: CT HEAD WITHOUT CONTRAST  CT CERVICAL SPINE WITHOUT CONTRAST  TECHNIQUE: Multidetector CT imaging of the head and cervical spine was performed following the standard protocol without intravenous contrast. Multiplanar CT image reconstructions of the cervical spine were also generated.  COMPARISON:  None.  FINDINGS: CT HEAD FINDINGS  Skull and Sinuses:Negative for fracture or destructive process. The mastoids, middle ears, and imaged paranasal sinuses are clear.  Orbits: No acute abnormality.  Brain: Unremarkable. No evidence of acute infarction, hemorrhage, hydrocephalus, or mass lesion/mass effect.  CT CERVICAL SPINE FINDINGS  Negative for acute fracture or subluxation. No prevertebral edema. No gross cervical canal hematoma.  Scattered facet arthropathy with mild overgrowth. No significant osseous canal or foraminal stenosis.  9 mm left thyroid nodule, likely incidental based on size.  IMPRESSION: No evidence of intracranial or cervical spine injury.   Electronically Signed   By: Marnee Spring M.D.   On: 05/10/2015 00:30   Ct Cervical Spine Wo Contrast  05/10/2015   CLINICAL DATA:  Fall with head injury and neck pain. Headache. Initial encounter.  EXAM: CT HEAD WITHOUT CONTRAST  CT CERVICAL SPINE WITHOUT CONTRAST  TECHNIQUE: Multidetector CT imaging of the head and cervical spine was performed following the standard protocol without intravenous contrast. Multiplanar CT image reconstructions of the cervical spine were also generated.  COMPARISON:  None.  FINDINGS: CT HEAD FINDINGS  Skull and Sinuses:Negative for fracture or destructive process. The mastoids, middle ears, and imaged paranasal sinuses are clear.  Orbits: No acute abnormality.  Brain: Unremarkable. No evidence of acute infarction, hemorrhage, hydrocephalus, or mass lesion/mass effect.  CT CERVICAL SPINE FINDINGS  Negative for acute fracture or subluxation. No prevertebral edema. No gross cervical canal hematoma. Scattered facet arthropathy with mild overgrowth. No significant osseous canal or foraminal stenosis.  9 mm left  thyroid nodule, likely incidental based on size.  IMPRESSION: No evidence of intracranial or cervical spine injury.   Electronically Signed   By: Marnee Spring M.D.   On: 05/10/2015 00:30   I, Lavera Guise, personally reviewed and evaluated these images as part of my medical decision-making.   MDM   Final diagnoses:  Fall  Right-sided low back pain without sciatica    In short, this is a 54 year old female who presents with headache, neck pain, low back pain after mechanical fall. She on presentation is nontoxic and in no acute distress dressed, but is very tearful, and reports that she is in significant pain. No evidence of acute injury  is noted on exam, but she does report neck tenderness, headache, low back pain. Given possible LOC with persistent headache, CT head was performed, and showed no acute intracranial processes. Cervical spine did not show any acute traumatic injuries, she is noted to have normal range of motion of her neck after clearance of her cervical collar. X-ray of her lumbar spine shows stable hardware, and no acute traumatic injuries. When I first reevaluated this patient, she again became very tearful and says that she was in a significant amount of pain. Prior to that she had received 50 g of fentanyl as well as tablet Norco with Valium. When I told her that she had no significant injuries, that she should treat symptoms at home with Motrin and Tylenol, she reports that "this never works for me. Can I get something stronger?" When reinforced that she had no traumatic injuries and does not need narcotics medications, she said "Well forget it. I want to go home now." When asked for muscle relaxant, I offered to write for Flexeril. She again states "Oh forget it, that stuff never works." She on her own, got off the bed and ambulated with steady gait did not seem to be in any significant pain. Discharged in stable condition.     Lavera Guise, MD 05/10/15 [phone]

## 2018-12-05 ENCOUNTER — Other Ambulatory Visit: Payer: Self-pay

## 2018-12-05 ENCOUNTER — Emergency Department (HOSPITAL_BASED_OUTPATIENT_CLINIC_OR_DEPARTMENT_OTHER): Payer: Medicaid Other

## 2018-12-05 ENCOUNTER — Emergency Department (HOSPITAL_BASED_OUTPATIENT_CLINIC_OR_DEPARTMENT_OTHER)
Admission: EM | Admit: 2018-12-05 | Discharge: 2018-12-05 | Disposition: A | Discharge status: Home / Self Care | Payer: Medicaid Other | Attending: Emergency Medicine | Admitting: Emergency Medicine

## 2018-12-05 ENCOUNTER — Encounter (HOSPITAL_BASED_OUTPATIENT_CLINIC_OR_DEPARTMENT_OTHER): Payer: Self-pay | Admitting: Emergency Medicine

## 2018-12-05 DIAGNOSIS — W0110XA Fall on same level from slipping, tripping and stumbling with subsequent striking against unspecified object, initial encounter: Secondary | ICD-10-CM | POA: Diagnosis not present

## 2018-12-05 DIAGNOSIS — I10 Essential (primary) hypertension: Secondary | ICD-10-CM | POA: Insufficient documentation

## 2018-12-05 DIAGNOSIS — J449 Chronic obstructive pulmonary disease, unspecified: Secondary | ICD-10-CM | POA: Diagnosis not present

## 2018-12-05 DIAGNOSIS — W19XXXA Unspecified fall, initial encounter: Secondary | ICD-10-CM

## 2018-12-05 DIAGNOSIS — Z79899 Other long term (current) drug therapy: Secondary | ICD-10-CM | POA: Diagnosis not present

## 2018-12-05 DIAGNOSIS — F17210 Nicotine dependence, cigarettes, uncomplicated: Secondary | ICD-10-CM | POA: Diagnosis not present

## 2018-12-05 DIAGNOSIS — M25561 Pain in right knee: Secondary | ICD-10-CM | POA: Insufficient documentation

## 2018-12-05 MED ORDER — ONDANSETRON HCL 4 MG/2ML IJ SOLN
4.0000 mg | Freq: Once | INTRAMUSCULAR | Status: AC
  Administered 2018-12-05: 4 mg via INTRAVENOUS
  Filled 2018-12-05: qty 2

## 2018-12-05 MED ORDER — MORPHINE SULFATE (PF) 4 MG/ML IV SOLN
4.0000 mg | Freq: Once | INTRAVENOUS | Status: AC
  Administered 2018-12-05: 4 mg via INTRAVENOUS
  Filled 2018-12-05: qty 1

## 2018-12-05 MED ORDER — NAPROXEN 500 MG PO TABS: 500.0000 mg | ORAL_TABLET | Freq: Two times a day (BID) | ORAL | 0 refills | Status: AC

## 2018-12-05 MED ORDER — KETOROLAC TROMETHAMINE 15 MG/ML IJ SOLN
15.0000 mg | Freq: Once | INTRAMUSCULAR | Status: AC
  Administered 2018-12-05: 15 mg via INTRAVENOUS
  Filled 2018-12-05: qty 1

## 2018-12-05 NOTE — ED Notes (Signed)
ED Provider at bedside. 

## 2018-12-05 NOTE — ED Provider Notes (Signed)
[facility] EMERGENCY DEPARTMENT Provider Note   CSN: [id number] Arrival date & time: 12/05/18  1649    History   Chief Complaint Chief Complaint  Patient presents with  . Fall  . Knee Pain    HPI Sally Austin is a 58 y.o. female with a hx of tobacco abuse, HTN, and COPD who presents tot he ED via EMS s/p mechanical fall just PTA with complaints of R knee pain. Patient was ambulating and felt that her R knee gave out causing her to fall onto it.  She denies prodromal lightheadedness, dizziness, chest pain, or dyspnea- none of these sxs at present.  Denies head injury or loss of consciousness.  She states she is having pain only to the R knee. Pain is severe, constant, worse with movement, mildly alleviated by fentanyl given by EMS in route.  Denies numbness, tingling, weakness, fever, or chills.    HPI  Past Medical History:  Diagnosis Date  . Asthma   . COPD (chronic obstructive pulmonary disease) (HCC)   . Hypertension     There are no active problems to display for this patient.   Past Surgical History:  Procedure Laterality Date  . ABDOMINAL HYSTERECTOMY    . APPENDECTOMY    . CHOLECYSTECTOMY    . TONSILLECTOMY       OB History   No obstetric history on file.      Home Medications    Prior to Admission medications   Medication Sig Start Date End Date Taking? Authorizing Provider  albuterol (PROVENTIL HFA;VENTOLIN HFA) 108 (90 BASE) MCG/ACT inhaler Inhale 2 puffs into the lungs every 6 (six) hours as needed for wheezing or shortness of breath.    [provider]  ALBUTEROL IN Inhale into the lungs.    [provider]  doxycycline (VIBRAMYCIN) 100 MG capsule Take 1 capsule (100 mg total) by mouth 2 (two) times daily. 04/03/15   [name], MD  HYDROcodone-acetaminophen (NORCO) 5-325 MG per tablet Take 1 tablet by mouth every 6 (six) hours as needed. 11/13/14   Toy Cookey, MD  LISINOPRIL PO Take 20 mg by mouth.     [provider]  Metoprolol Succinate (TOPROL XL PO) Take by mouth.    [provider]  naproxen (NAPROSYN) 500 MG tablet Take 1 tablet (500 mg total) by mouth 2 (two) times daily with a meal. 11/13/14   Toy Cookey, MD  oxyCODONE-acetaminophen (PERCOCET/ROXICET) 5-325 MG per tablet Take 1 tablet by mouth every 4 (four) hours as needed for severe pain. 04/03/15   [name], MD  predniSONE (DELTASONE) 10 MG tablet Take 2 tablets (20 mg total) by mouth daily. 04/03/15   [name], MD  TRAMADOL HCL ER PO Take by mouth.    [provider]  VERAPAMIL HCL ER, CO, PO Take by mouth.    [provider]    Family History History reviewed. No pertinent family history.  Social History Social History   Tobacco Use  . Smoking status: Current Every Day Smoker    Packs/day: 1.00    Types: Cigarettes  . Smokeless tobacco: Never Used  Substance Use Topics  . Alcohol use: No  . Drug use: No     Allergies   Patient has no known allergies.   Review of Systems Review of Systems  Constitutional: Negative for chills, fatigue and fever.  Respiratory: Negative for shortness of breath.   Cardiovascular: Negative for chest pain.  Musculoskeletal: Positive for arthralgias (R knee).  Negative for back pain and neck pain.  Skin: Negative for wound.  Neurological: Negative for dizziness, syncope, weakness, light-headedness and numbness.     Physical Exam Updated Vital Signs BP (!) 155/95 (BP Location: Left Arm)   Pulse 98   Temp 98.8 F (37.1 C) (Oral)   Resp 20   Ht 5\' 10"  (1.778 m)   Wt 87.1 kg   SpO2 98%   BMI 27.55 kg/m   Physical Exam Vitals signs and nursing note reviewed.  Constitutional:      General: She is in acute distress (Patient appears mildly uncomfortable).     Appearance: She is well-developed. She is not toxic-appearing.  HENT:     Head: Normocephalic and atraumatic.     Comments: No raccoon eyes or battle sign.  No hemotympanum. Eyes:      General:        Right eye: No discharge.        Left eye: No discharge.     Conjunctiva/sclera: Conjunctivae normal.  Neck:     Musculoskeletal: Normal range of motion and neck supple.     Comments: No midline cervical tenderness. Cardiovascular:     Rate and Rhythm: Normal rate and regular rhythm.     Comments: 2+ DP/PT pulses. Pulmonary:     Effort: Pulmonary effort is normal. No respiratory distress.     Breath sounds: Normal breath sounds. No wheezing, rhonchi or rales.  Chest:     Chest wall: No tenderness.  Abdominal:     General: There is no distension.     Palpations: Abdomen is soft.     Tenderness: There is no abdominal tenderness.  Musculoskeletal:     Comments: No obvious deformity, appreciable swelling, erythema, ecchymosis, warmth, or open wounds Upper extremities: Normal range of motion throughout without palpable bony tenderness. Back: No midline tenderness to palpation Lower extremities: Patient has active range of motion intact bilateral hips, ankles, and to the left knee.  Right knee able to be fully extended and able to flex to about 90 degrees.  Limited secondary to pain.  Patient tender to the diffuse anterior right knee including the patella.  Lower extremities are otherwise nontender.  Skin:    General: Skin is warm and dry.     Findings: No rash.  Neurological:     Mental Status: She is alert.     Comments: Clear speech.  Sensation grossly intact bilateral lower extremities.  5 out of 5 strength with plantar dorsiflexion bilaterally.  Psychiatric:        Behavior: Behavior normal.      ED Treatments / Results  Labs (all labs ordered are listed, but only abnormal results are displayed) Labs Reviewed - No data to display  EKG None  Radiology Dg Knee Complete 4 Views Right  Result Date: 12/05/2018 CLINICAL DATA:  Right knee pain EXAM: RIGHT KNEE - COMPLETE 4+ VIEW COMPARISON:  Right knee radiograph 03/16/2018 FINDINGS: Multiple prominent  patellar enthesophytes. No knee effusion. No fracture or dislocation. Femorotibial joint spaces are preserved. IMPRESSION: No fracture or dislocation of the right knee. Mild osteoarthrosis. Electronically Signed   By: Deatra Robinson M.D.   On: 12/05/2018 18:15    Procedures Procedures (including critical care time)  Medications Ordered in ED Medications  ondansetron (ZOFRAN) injection 4 mg (4 mg Intravenous Given 12/05/18 1719)  morphine 4 MG/ML injection 4 mg (4 mg Intravenous Given 12/05/18 1722)  ketorolac (TORADOL) 15 MG/ML injection 15 mg (15 mg Intravenous Given 12/05/18 1834)  Initial Impression / Assessment and Plan / ED Course  I have reviewed the triage vital signs and the nursing notes.  Pertinent labs & imaging results that were available during my care of the patient were reviewed by me and considered in my medical decision making (see chart for details).    Patient presents to the ED complaining of right knee pain s/p mechanical fall. No prodromal sxs..  Patient is nontoxic appearing, seems somewhat uncomfortable, vitals without significant abnormality- BP elevated- doubt HTN emergency, PCP recheck. Patient without signs of serious head, neck, or back injury. [place] CT head injury/trauma rule and C-spine rule suggest no imaging required. Patient has no focal neurologic deficits or point/focal midline spinal tenderness to palpation, doubt fracture or dislocation of the spine, doubt head bleed. No seat belt sign or chest/abdominal tenderness to indicate acute intra-thoracic/intra-abdominal injury.Marland Kitchen Appears to have isolated injury to the right knee.. Exam without obvious deformity or open wounds. ROM mildly limited w/ flexion. Tender to palpation anteriorly. NVI distally. Xray negative for fracture/dislocation. No obvious joint instability.  Recommended knee immobilizer which patient adamantly refused.  She ultimately was agreeable to knee sleeve.  Crutches provided.  Naproxen  prescription- last creatinine WNL on chart review. PRICE recommended. I discussed results, treatment plan, need for follow-up, and return precautions with the patient. Provided opportunity for questions, patient confirmed understanding and are in agreement with plan.    Final Clinical Impressions(s) / ED Diagnoses   Final diagnoses:  Fall, initial encounter  Acute pain of right knee    ED Discharge Orders         Ordered    naproxen (NAPROSYN) 500 MG tablet  2 times daily     12/05/18 1841           Cherly Anderson, PA-C 12/05/18 1910    Rolan Bucco, MD 12/05/18 2240

## 2018-12-05 NOTE — ED Notes (Signed)
Patient requesting meds before imaging

## 2018-12-05 NOTE — Discharge Instructions (Addendum)
Please read and follow all provided instructions.  You have been seen today for a fall with knee pain.   Tests performed today include: An x-ray of the affected area - does NOT show any broken bones or dislocations.  Vital signs. See below for your results today.   Home care instructions: -- *PRICE in the first 24-48 hours after injury: Protect (with brace, splint, sling), if given by your provider Rest Ice- Do not apply ice pack directly to your skin, place towel or similar between your skin and ice/ice pack. Apply ice for 20 min, then remove for 40 min while awake Compression- Wear brace, elastic bandage, splint as directed by your provider Elevate affected extremity above the level of your heart when not walking around for the first 24-48 hours   Medications:  - Naproxen is a nonsteroidal anti-inflammatory medication that will help with pain and swelling. Be sure to take this medication as prescribed with food, 1 pill every 12 hours,  It should be taken with food, as it can cause stomach upset, and more seriously, stomach bleeding. Do not take other nonsteroidal anti-inflammatory medications with this such as Advil, Motrin, Aleve, Mobic, Goodie Powder, or Motrin.    You make take Tylenol per over the counter dosing with these medications.   We have prescribed you new medication(s) today. Discuss the medications prescribed today with your pharmacist as they can have adverse effects and interactions with your other medicines including over the counter and prescribed medications. Seek medical evaluation if you start to experience new or abnormal symptoms after taking one of these medicines, seek care immediately if you start to experience difficulty breathing, feeling of your throat closing, facial swelling, or rash as these could be indications of a more serious allergic reaction  Follow-up instructions: Please follow-up with your primary care provider or the provided orthopedic physician  (bone specialist) if you continue to have significant pain in 1 week. In this case you may have a more severe injury that requires further care.   Return instructions:  Please return if your digits or extremity are numb or tingling, appear gray or blue, or you have severe pain (also elevate the extremity and loosen splint or wrap if you were given one) Please return if you have redness or fevers.  Please return to the Emergency Department if you experience worsening symptoms.  Please return if you have any other emergent concerns. Additional Information:  Your vital signs today were: BP (!) 155/95 (BP Location: Left Arm)    Pulse 98    Temp 98.8 F (37.1 C) (Oral)    Resp 20    Ht 5\' 10"  (1.778 m)    Wt 87.1 kg    SpO2 98%    BMI 27.55 kg/m  If your blood pressure (BP) was elevated above 135/85 this visit, please have this repeated by your doctor within one month. ---------------

## 2018-12-05 NOTE — ED Triage Notes (Signed)
Reports fall with injury to right knee when in the shower.

## 2018-12-05 NOTE — ED Triage Notes (Addendum)
Per EMS:  Pt slipped and fell, injured right knee.  Pt received pain medication in route.  No obvious deformity.

## 2019-12-24 DEATH — deceased

## 2020-01-05 IMAGING — DX RIGHT KNEE - COMPLETE 4+ VIEW
4 series · 4 of 4 positions shown · non-contrast
Comparison: Right knee radiograph 03/16/2018

CLINICAL DATA: Right knee pain

EXAM:
RIGHT KNEE - COMPLETE 4+ VIEW

[knee ap]
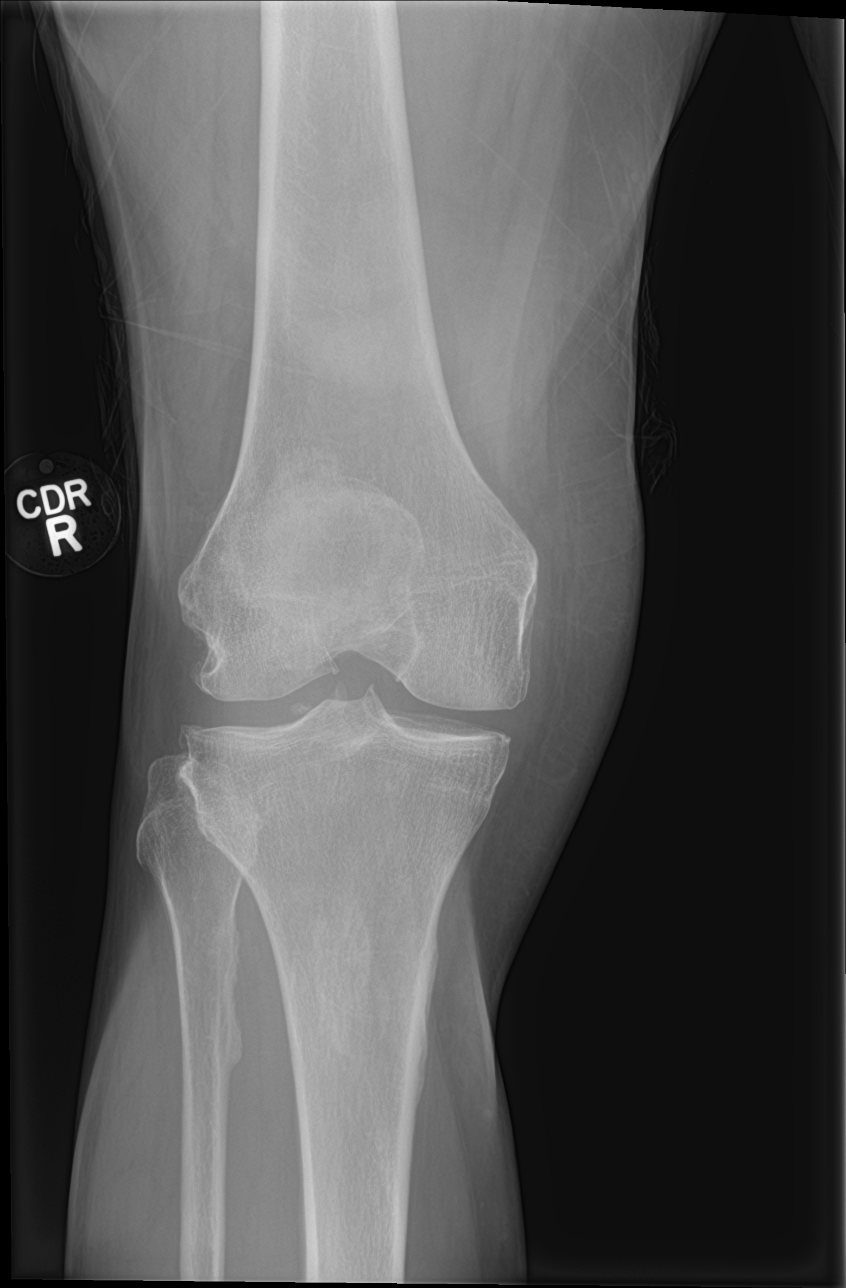

[knee lat]
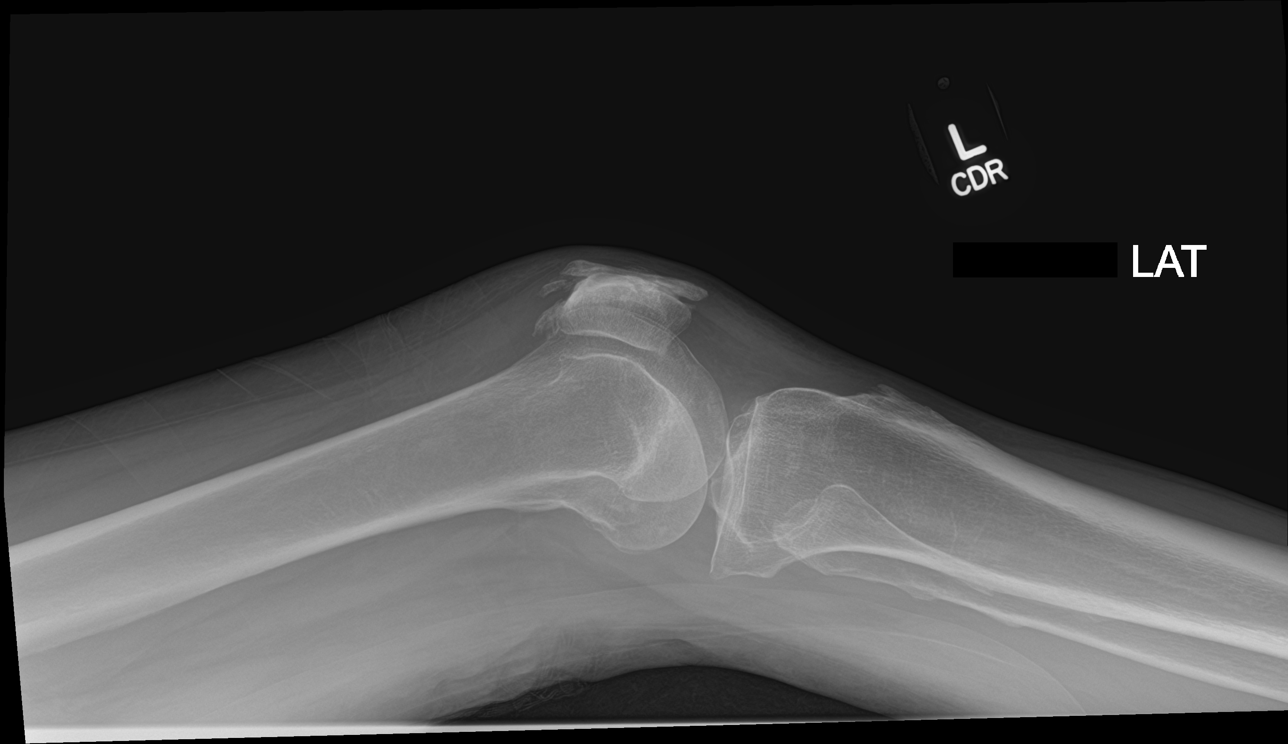

[knee obl (1 of 2)]
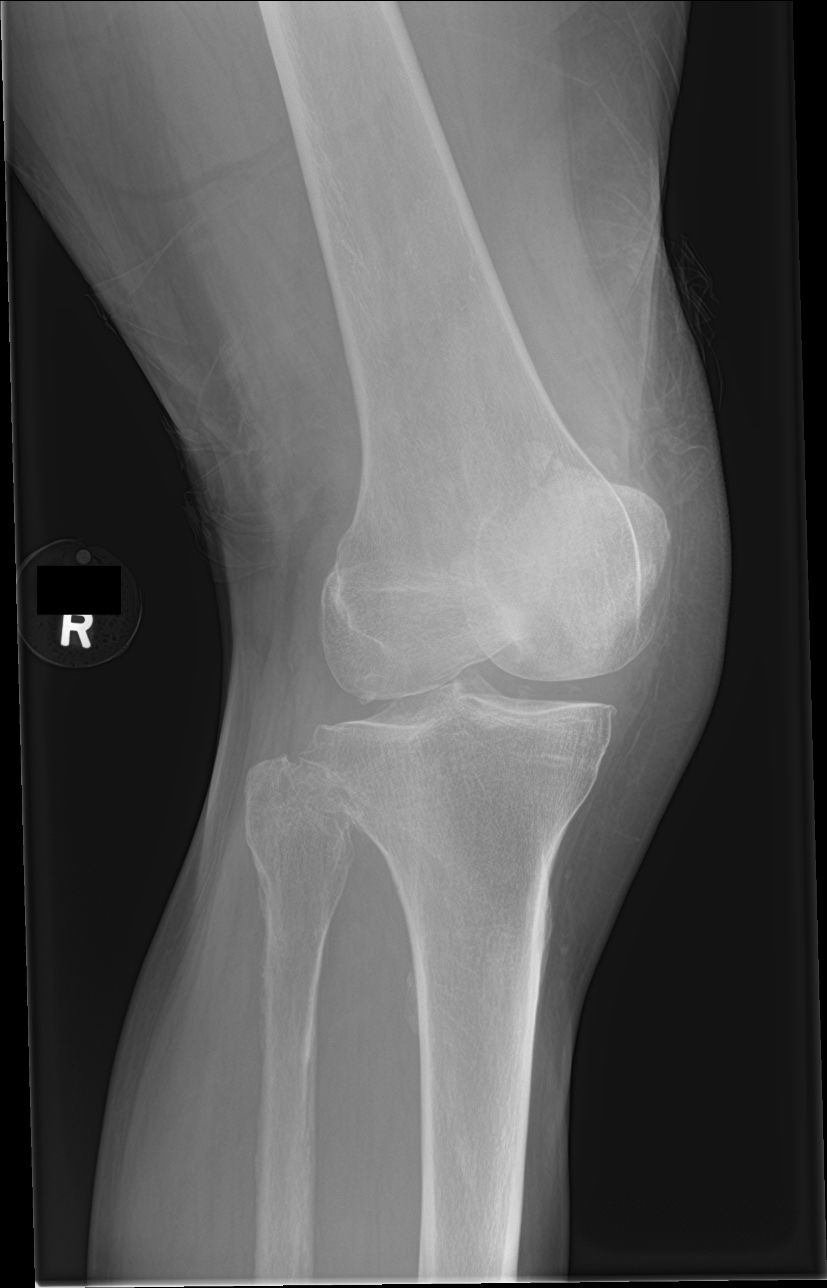

[knee obl (2 of 2)]
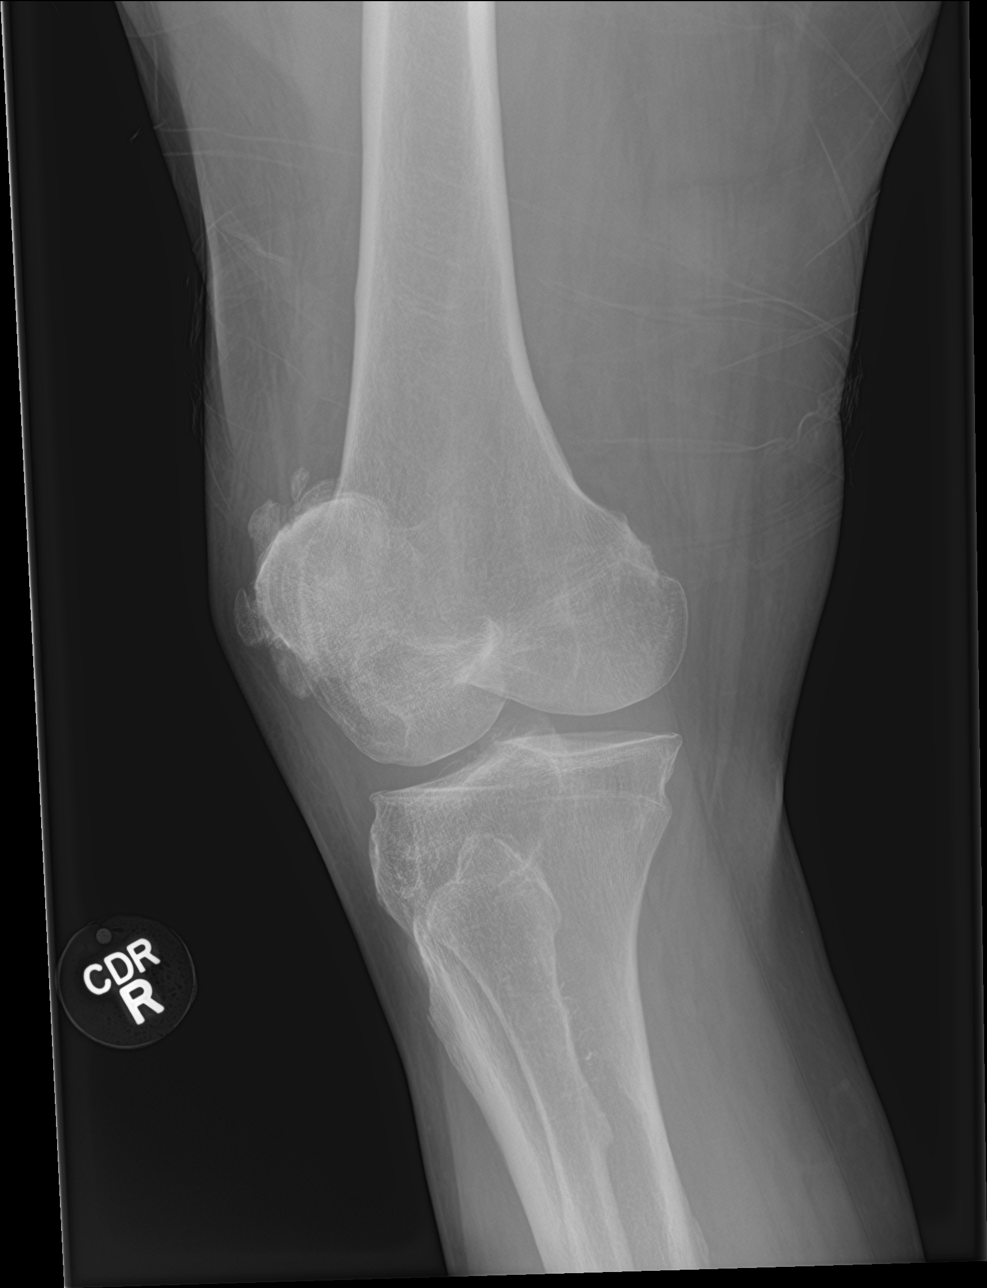

[4 of 4 positions shown; findings below may reference images not displayed]

FINDINGS: Multiple prominent patellar enthesophytes. No knee effusion. No
fracture or dislocation. Femorotibial joint spaces are preserved.
IMPRESSION: No fracture or dislocation of the right knee. Mild osteoarthrosis.
